# Patient Record
Sex: Female | Born: 1995 | Race: White | Hispanic: No | Marital: Single | Smoking: Never smoker
Health system: Southern US, Community
[De-identification: ages and names within clinical notes are randomized; demographics above are authoritative.]

## PROBLEM LIST (undated history)

## (undated) HISTORY — PX: HIP SURGERY: SHX245

## (undated) HISTORY — PX: KNEE SURGERY: SHX244

---
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
## 2018-06-14 IMAGING — US US ART/VEN ABD/PELV/SCROTUM DOPPLER LTD
1 series · 14 of 25 positions shown · non-contrast
Comparison: CT 08/26/2014 .

CLINICAL DATA: Right lower quadrant pain.

EXAM:
TRANSABDOMINAL AND TRANSVAGINAL ULTRASOUND OF PELVIS
DOPPLER ULTRASOUND OF OVARIES
TECHNIQUE: Both transabdominal and transvaginal ultrasound examinations of the
pelvis were performed. Transabdominal technique was performed for
global imaging of the pelvis including uterus, ovaries, adnexal
regions, and pelvic cul-de-sac.
It was necessary to proceed with endovaginal exam following the
transabdominal exam to visualize the uterus and ovaries. Color and
duplex Doppler ultrasound was utilized to evaluate blood flow to the
ovaries.

[Series 1: us art/ven abd/pelv/scrotum doppler ltd · 0.28mm/px · 14 of 51 slices shown]
[im 1/51]
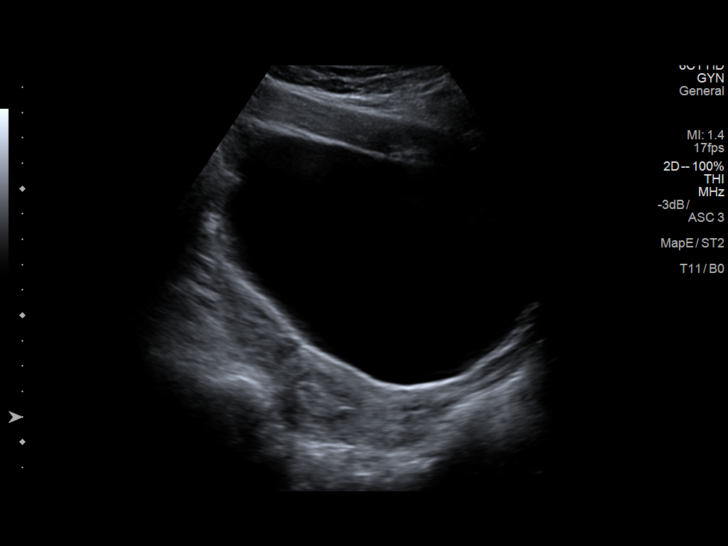
[im 5/51]
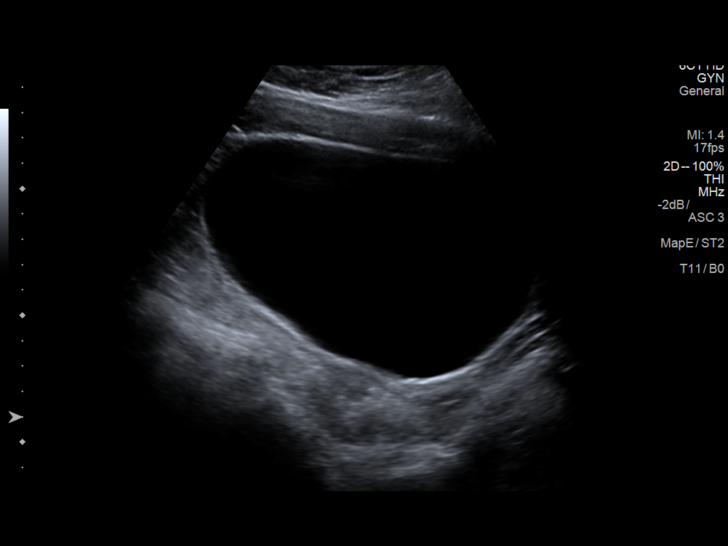
[im 9/51]
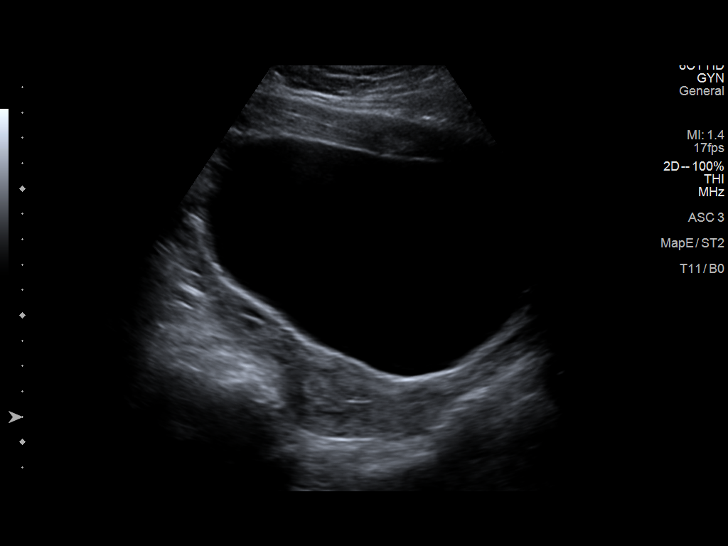
[im 13/51]
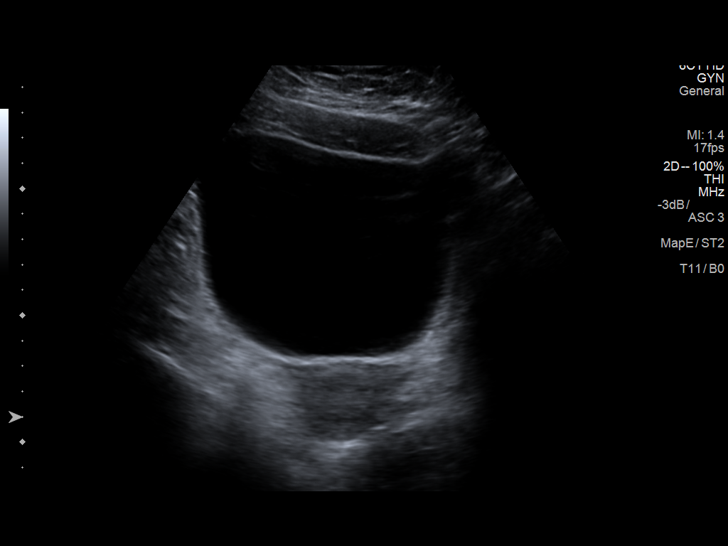
[im 17/51]
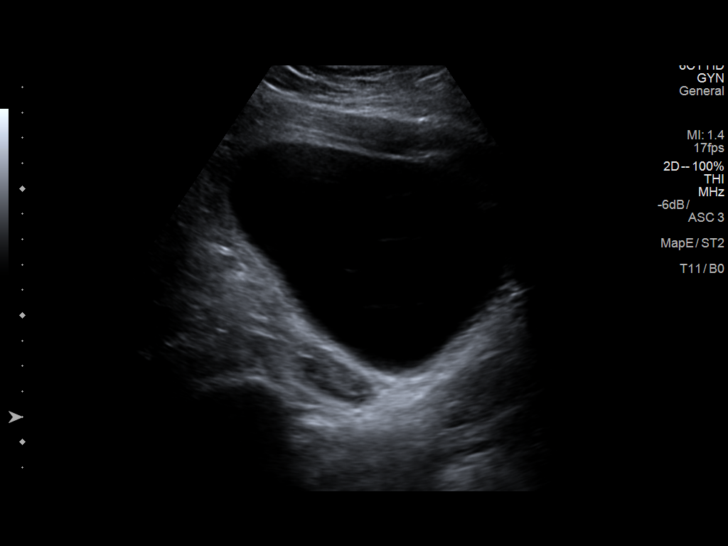
[im 19/51]
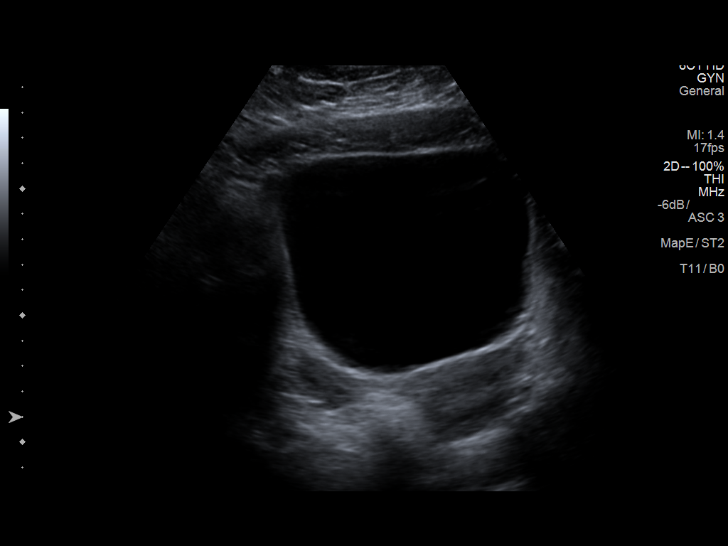
[im 23/51]
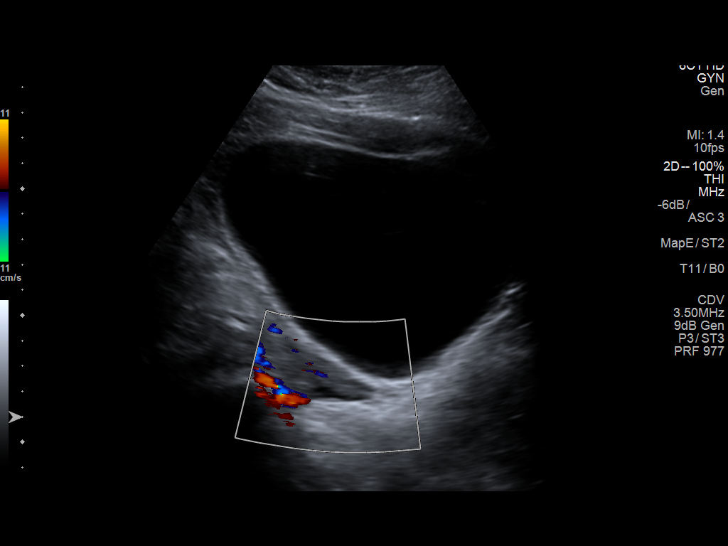
[im 28/51]
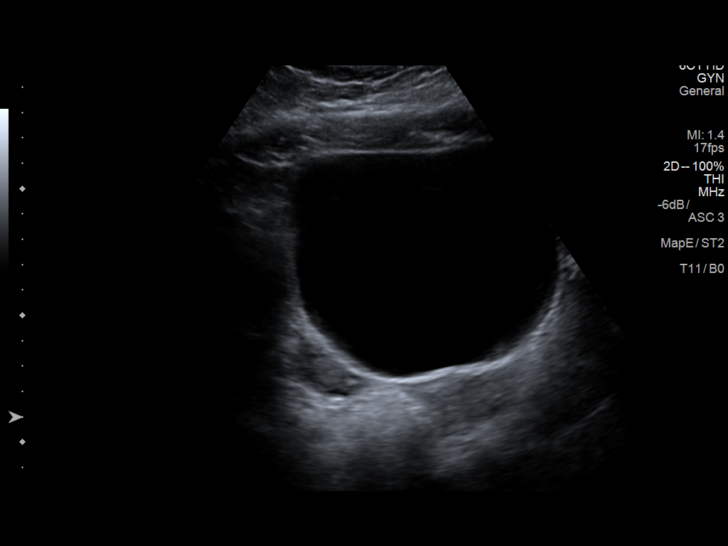
[im 32/51]
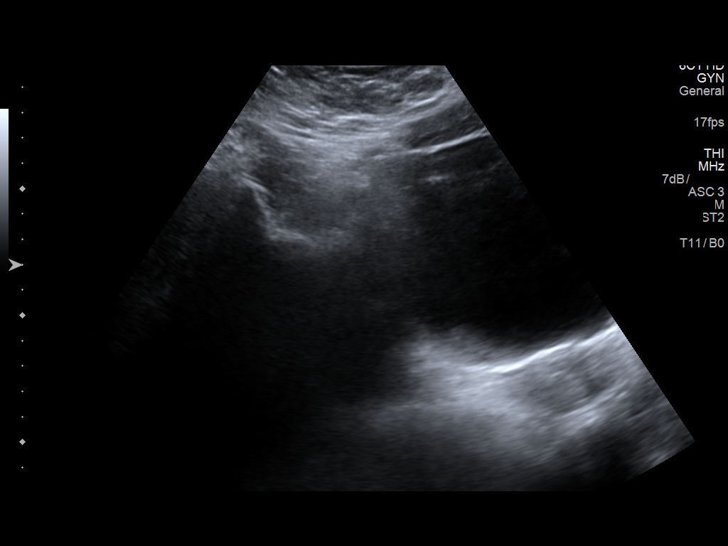
[im 34/51]
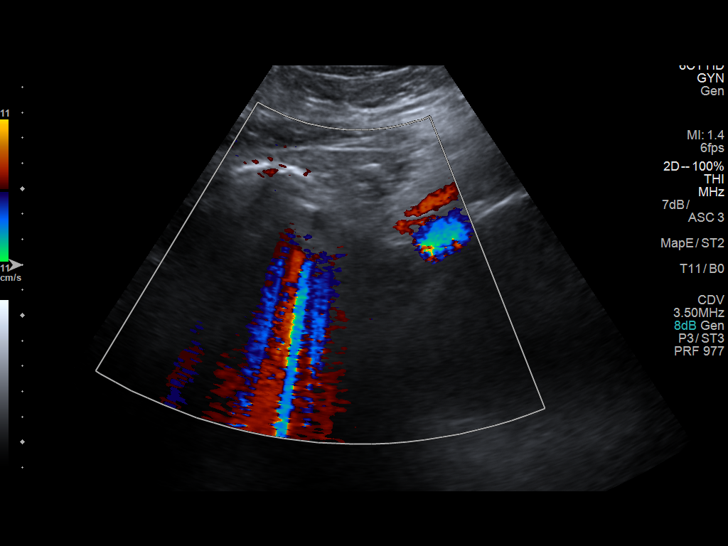
[im 38/51]
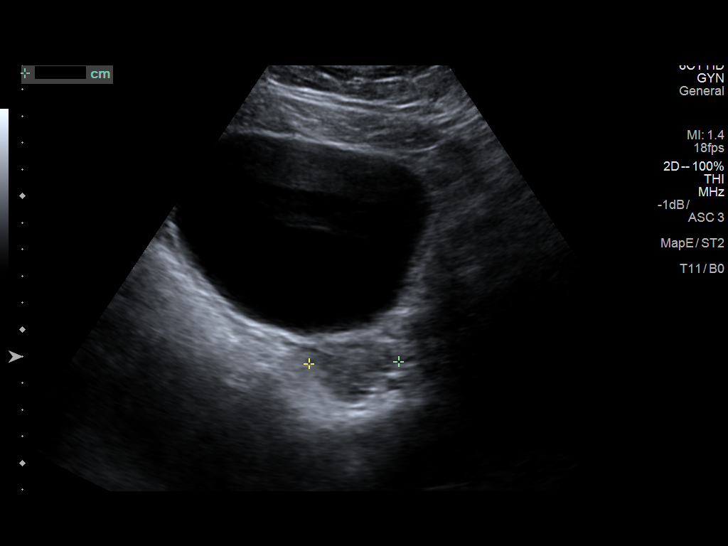
[im 42/51]
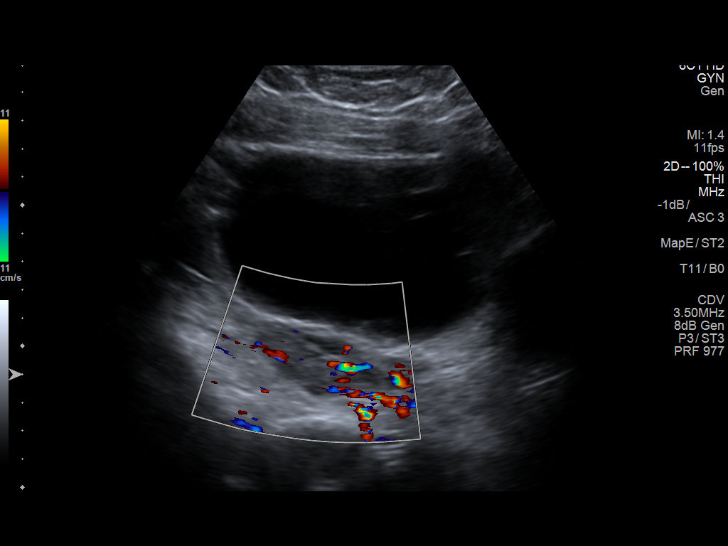
[im 46/51]
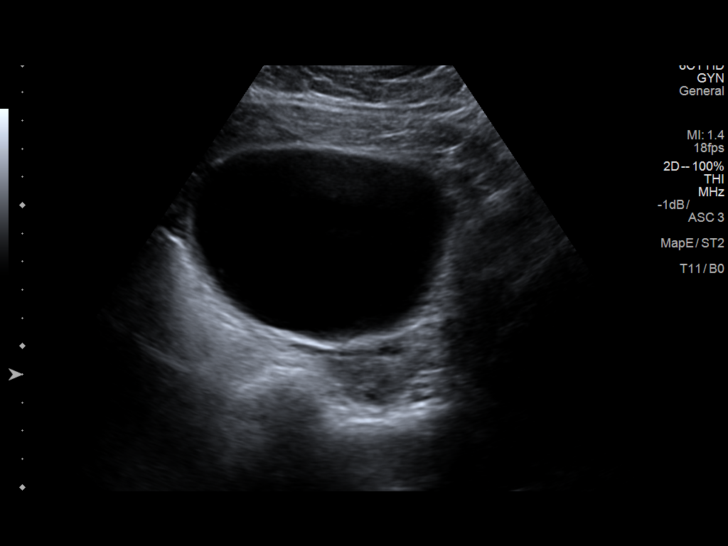
[im 51/51]
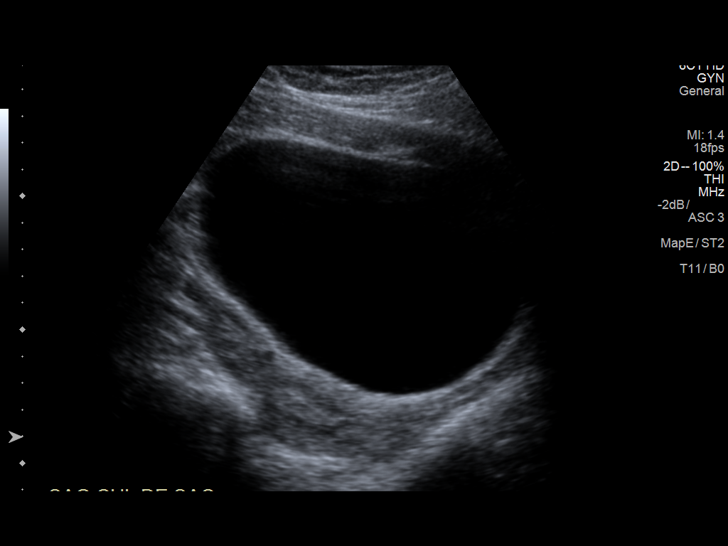

[14 of 25 positions shown; findings below may reference images not displayed]

FINDINGS: Uterus

Measurements: 7.0 x 3.2 x 4.8 cm. No fibroids or other mass
visualized.

Endometrium

Thickness: 11.3 mm.  No focal abnormality visualized.

Right ovary

Measurements: 4.1 x 1.7 x 2.3 cm. Normal appearance/no adnexal mass.

Left ovary

Measurements: 4.5 x 2.3 x 3.4 cm. Normal appearance/no adnexal mass.

Pulsed Doppler evaluation of both ovaries demonstrates normal
low-resistance arterial and venous waveforms.

Other findings

No abnormal free fluid.
IMPRESSION: No acute or focal abnormality.

## 2018-07-24 IMAGING — CT CT ABD-PELV W/O CM
2 of 4 series · 16 of 46 positions shown, 18 images · non-contrast
Comparison: None.

CLINICAL DATA: Right lower quadrant pain since last night with
vomiting and chills.

EXAM:
CT ABDOMEN AND PELVIS WITHOUT CONTRAST
TECHNIQUE: Multidetector CT imaging of the abdomen and pelvis was performed
following the standard protocol without IV contrast. Unable to
obtain IV access for contrast.

[Series 2: axial st · axial · 0.98mm/px · z∈[-514,-4]mm · 13 of 112 slices shown, 15 images]
[im 5/112  soft-tissue]
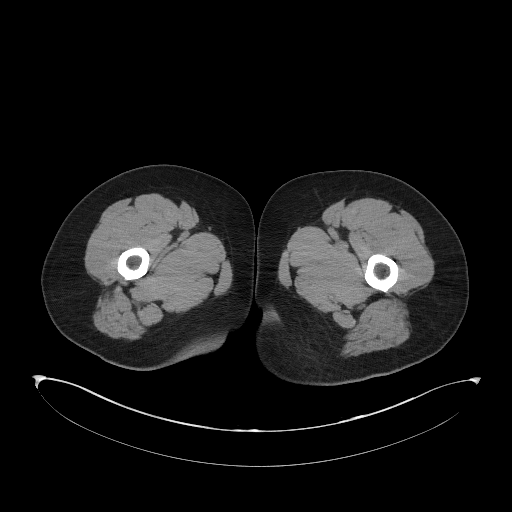
[im 5/112  bone]
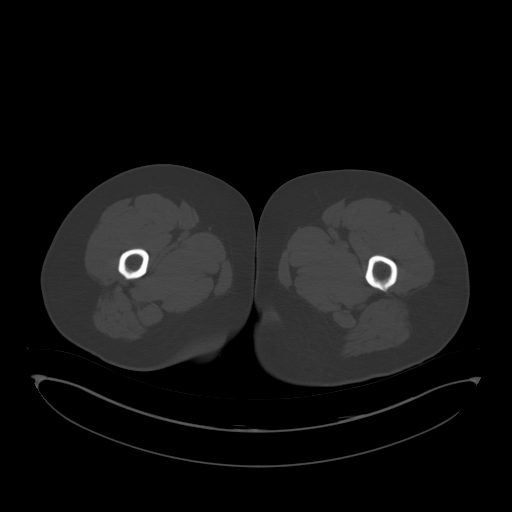
[im 13/112  soft-tissue]
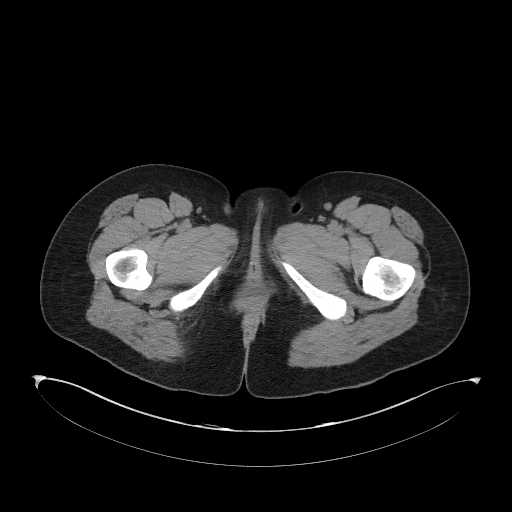
[im 22/112  soft-tissue]
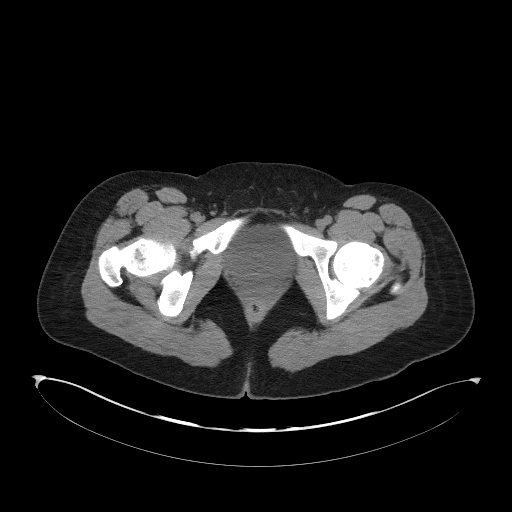
[im 30/112  soft-tissue]
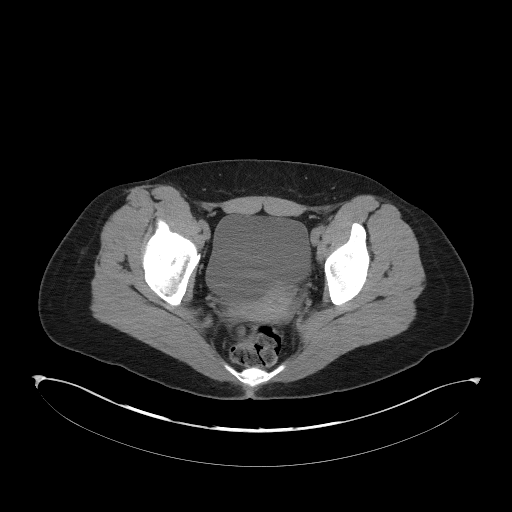
[im 39/112  soft-tissue]
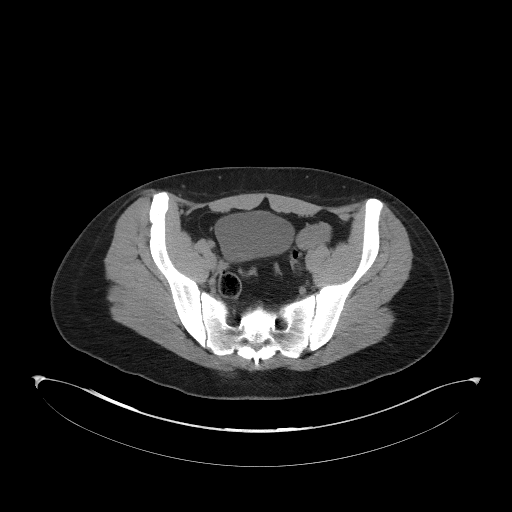
[im 47/112  soft-tissue]
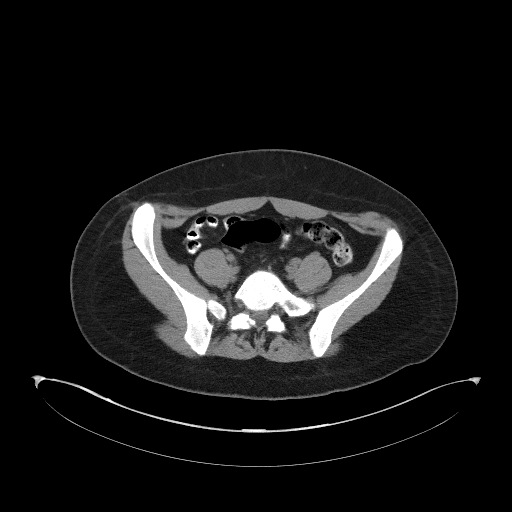
[im 56/112  soft-tissue]
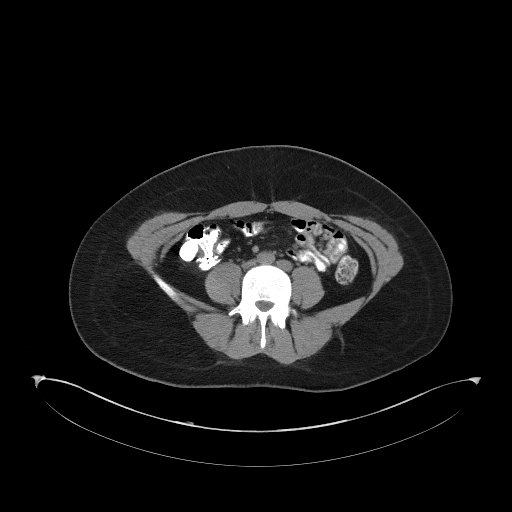
[im 65/112  soft-tissue]
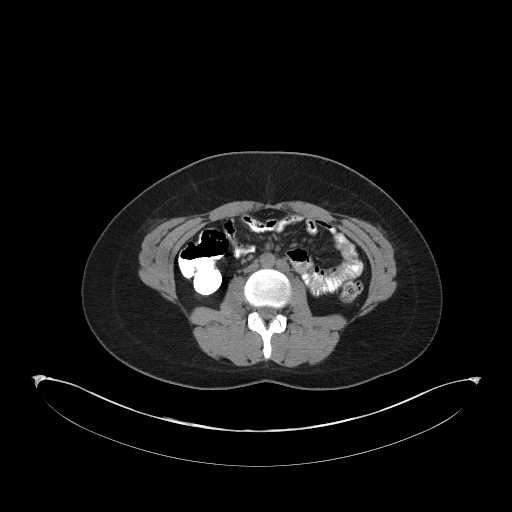
[im 73/112  soft-tissue]
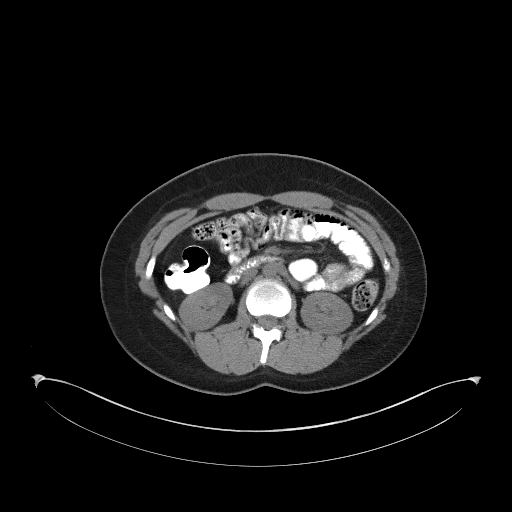
[im 73/112  bone]
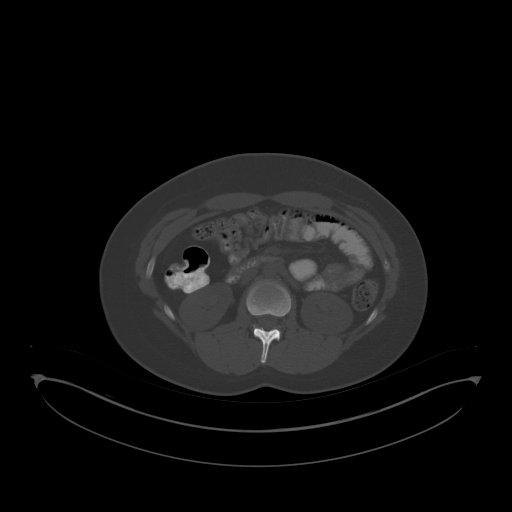
[im 82/112  soft-tissue]
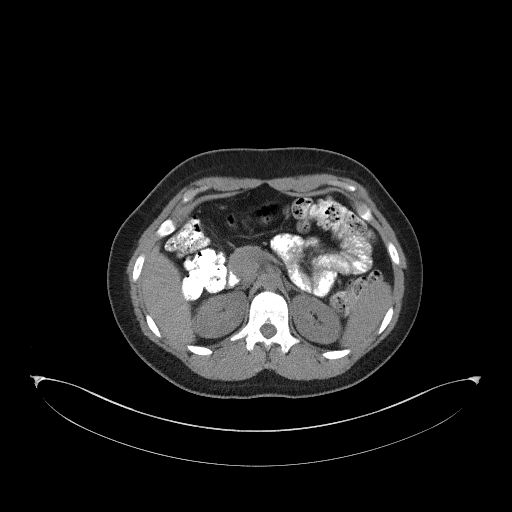
[im 90/112  soft-tissue]
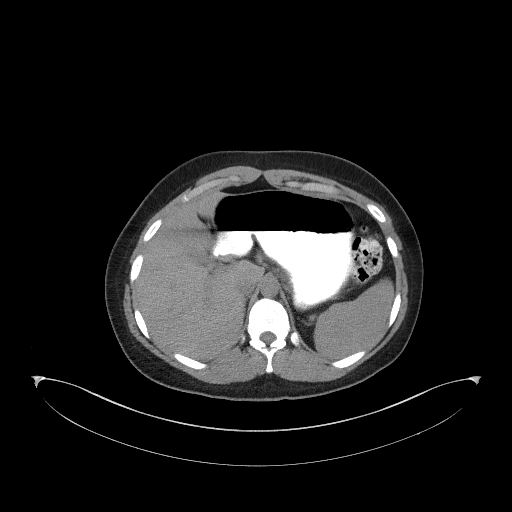
[im 99/112  soft-tissue]
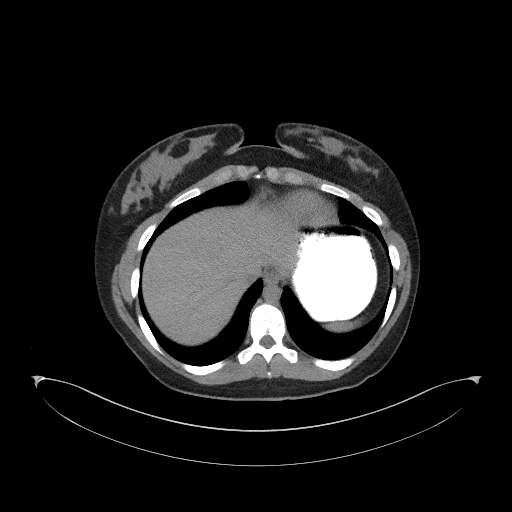
[im 107/112  soft-tissue]
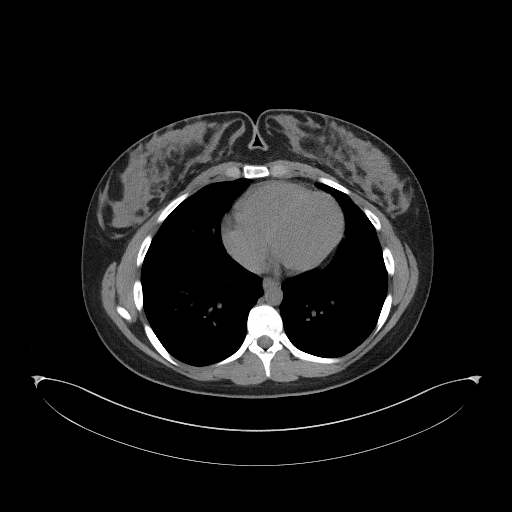

[Series 5: coronal st · coronal · 0.85mm/px · 3 of 96 slices shown]
[im 32/96  soft-tissue]
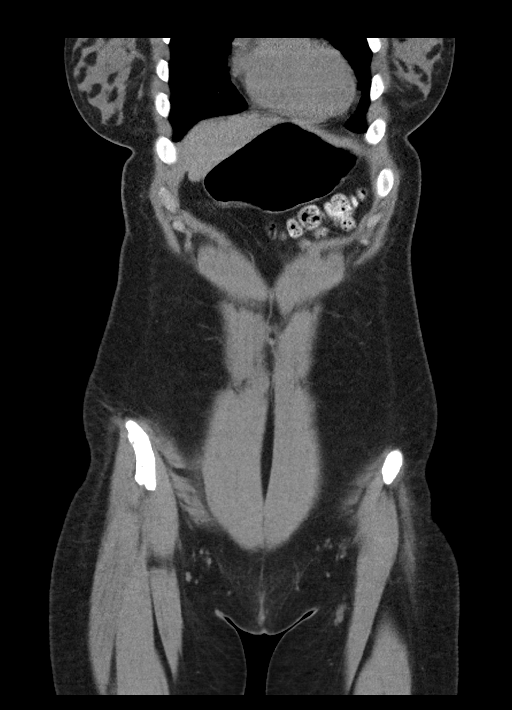
[im 43/96  soft-tissue]
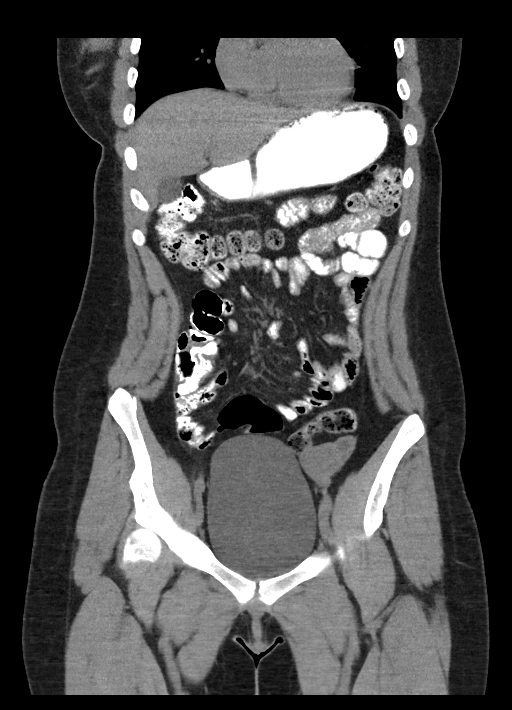
[im 53/96  soft-tissue]
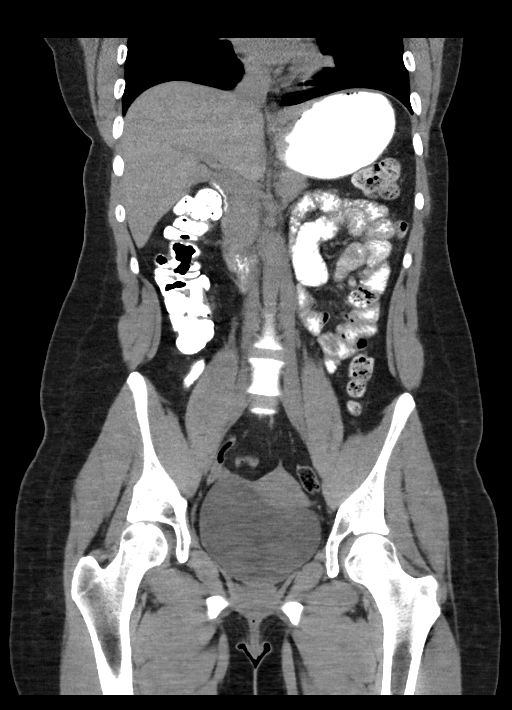

[16 of 46 positions shown; findings below may reference images not displayed]

FINDINGS: Lower chest: Within normal.

Hepatobiliary: Within normal.

Pancreas: Possible very subtle ill definition of the fat planes
adjacent the pancreatic head. Common bile duct not well seen over
the pancreatic head.

Spleen: Within normal.

Adrenals/Urinary Tract: Adrenal glands are normal. Kidneys are
normal in size without hydronephrosis or nephrolithiasis. Ureters
and bladder are normal.

Stomach/Bowel: Stomach is somewhat distended with contrast, although
no evidence of gastric outlet obstruction. Small bowel is within
normal. Appendix is normal. Colon is unremarkable.

Vascular/Lymphatic: Within normal.

Reproductive: Within normal.

Other: Tiny amount of free fluid in the pelvis likely physiologic.

Musculoskeletal: Within normal.
IMPRESSION: Very subtle findings involving the pancreatic head as cannot exclude
mild acute pancreatitis. Recommend clinical correlation and consider
right upper quadrant ultrasound for evaluation.

## 2019-10-16 ENCOUNTER — Other Ambulatory Visit (HOSPITAL_BASED_OUTPATIENT_CLINIC_OR_DEPARTMENT_OTHER): Payer: Self-pay | Admitting: Emergency Medicine

## 2019-10-16 DIAGNOSIS — R519 Headache, unspecified: Secondary | ICD-10-CM
